# Patient Record
Sex: Male | Born: 1990 | Race: White | Hispanic: No | Marital: Single | Smoking: Current every day smoker
Health system: Southern US, Community
[De-identification: ages and names within clinical notes are randomized; demographics above are authoritative.]

## PROBLEM LIST (undated history)

## (undated) DIAGNOSIS — J209 Acute bronchitis, unspecified: Secondary | ICD-10-CM

## (undated) HISTORY — PX: BLADDER SURGERY: SHX569

---

## 2010-09-23 ENCOUNTER — Emergency Department (HOSPITAL_COMMUNITY)
Admission: EM | Admit: 2010-09-23 | Discharge: 2010-09-23 | Payer: Self-pay | Source: Home / Self Care | Admitting: Emergency Medicine

## 2012-02-16 ENCOUNTER — Emergency Department (HOSPITAL_COMMUNITY)
Admission: EM | Admit: 2012-02-16 | Discharge: 2012-02-16 | Disposition: A | Discharge status: Home / Self Care | Payer: Self-pay | Attending: Emergency Medicine | Admitting: Emergency Medicine

## 2012-02-16 ENCOUNTER — Encounter (HOSPITAL_COMMUNITY): Payer: Self-pay | Admitting: *Deleted

## 2012-02-16 DIAGNOSIS — F17200 Nicotine dependence, unspecified, uncomplicated: Secondary | ICD-10-CM | POA: Insufficient documentation

## 2012-02-16 DIAGNOSIS — L239 Allergic contact dermatitis, unspecified cause: Secondary | ICD-10-CM

## 2012-02-16 DIAGNOSIS — R21 Rash and other nonspecific skin eruption: Secondary | ICD-10-CM | POA: Insufficient documentation

## 2012-02-16 HISTORY — DX: Acute bronchitis, unspecified: J20.9

## 2012-02-16 MED ORDER — EPINEPHRINE 0.3 MG/0.3ML IJ DEVI
0.3000 mg | Freq: Once | INTRAMUSCULAR | Status: AC
  Administered 2012-02-16: 0.3 mg via INTRAMUSCULAR
  Filled 2012-02-16: qty 0.3

## 2012-02-16 MED ORDER — DIPHENHYDRAMINE HCL 12.5 MG/5ML PO ELIX
25.0000 mg | ORAL_SOLUTION | Freq: Once | ORAL | Status: AC
  Administered 2012-02-16: 25 mg via ORAL
  Filled 2012-02-16: qty 10

## 2012-02-16 MED ORDER — METHYLPREDNISOLONE SODIUM SUCC 125 MG IJ SOLR
125.0000 mg | Freq: Once | INTRAMUSCULAR | Status: AC
  Administered 2012-02-16: 125 mg via INTRAVENOUS
  Filled 2012-02-16: qty 2

## 2012-02-16 NOTE — ED Provider Notes (Signed)
History     CSN: [id number]  Arrival date & time 02/16/12  0135   First MD Initiated Contact with Patient 02/16/12 0154      Chief Complaint  Patient presents with  . Rash    (Consider location/radiation/quality/duration/timing/severity/associated sxs/prior treatment) Patient is a 21 y.o. male presenting with rash. The history is provided by the patient (pt complains of a rash).  Rash  This is a new problem. The current episode started 2 days ago. The problem has not changed since onset.The problem is associated with nothing. There has been no fever. The rash is present on the torso. The pain is at a severity of 0/10. The patient is experiencing no pain. Pertinent negatives include no blisters.    Past Medical History  Diagnosis Date  . Bronchitis, acute     History reviewed. No pertinent past surgical history.  History reviewed. No pertinent family history.  History  Substance Use Topics  . Smoking status: Current Everyday Smoker -- 1.0 packs/day  . Smokeless tobacco: Not on file  . Alcohol Use: No     occasional      Review of Systems  Constitutional: Negative for fatigue.  HENT: Negative for congestion, sinus pressure and ear discharge.   Eyes: Negative for discharge.  Respiratory: Negative for cough.   Cardiovascular: Negative for chest pain.  Gastrointestinal: Negative for abdominal pain and diarrhea.  Genitourinary: Negative for frequency and hematuria.  Musculoskeletal: Negative for back pain.  Skin: Positive for rash.  Neurological: Negative for seizures and headaches.  Hematological: Negative.   Psychiatric/Behavioral: Negative for hallucinations.    Allergies  Review of patient's allergies indicates no known allergies.  Home Medications  No current outpatient prescriptions on file.  BP 136/77  Pulse 86  Temp 98 F (36.7 C)  Resp 20  Ht 6' (1.829 m)  Wt 150 lb (68.04 kg)  BMI 20.34 kg/m2  SpO2 97%  Physical Exam  Constitutional: He is  oriented to person, place, and time. He appears well-developed.  HENT:  Head: Normocephalic and atraumatic.  Eyes: Conjunctivae and EOM are normal. No scleral icterus.  Neck: Neck supple. No thyromegaly present.  Cardiovascular: Normal rate and regular rhythm.  Exam reveals no gallop and no friction rub.   No murmur heard. Pulmonary/Chest: No stridor. He has no wheezes. He has no rales. He exhibits no tenderness.  Abdominal: He exhibits no distension. There is no tenderness. There is no rebound.  Musculoskeletal: Normal range of motion. He exhibits no edema.  Lymphadenopathy:    He has no cervical adenopathy.  Neurological: He is oriented to person, place, and time. Coordination normal.  Skin: Rash noted. No erythema.  Psychiatric: He has a normal mood and affect. His behavior is normal.    ED Course  Procedures (including critical care time)  Labs Reviewed - No data to display No results found.   1. Rash     Pt improved with tx  MDM  Allergic dermatitis        Benny Lennert, MD 02/16/12 [phone]

## 2012-02-16 NOTE — ED Notes (Signed)
Has removed a number of ticks from himself - thinks the last time was a month ago - not sure

## 2012-02-16 NOTE — Discharge Instructions (Signed)
Take benadryl for the rash.  Follow up next week if not improving.

## 2012-02-16 NOTE — ED Notes (Signed)
Pt reporting rash on arms and trunk. States rash began today.  Pt reporting minor cough.  Also reports he passed out at work on Friday.

## 2013-10-08 ENCOUNTER — Emergency Department (HOSPITAL_COMMUNITY)
Admission: EM | Admit: 2013-10-08 | Discharge: 2013-10-09 | Disposition: A | Discharge status: Home / Self Care | Payer: Self-pay | Attending: Emergency Medicine | Admitting: Emergency Medicine

## 2013-10-08 ENCOUNTER — Emergency Department (HOSPITAL_COMMUNITY): Payer: Self-pay

## 2013-10-08 ENCOUNTER — Encounter (HOSPITAL_COMMUNITY): Payer: Self-pay | Admitting: Emergency Medicine

## 2013-10-08 DIAGNOSIS — Z8709 Personal history of other diseases of the respiratory system: Secondary | ICD-10-CM | POA: Insufficient documentation

## 2013-10-08 DIAGNOSIS — Z9889 Other specified postprocedural states: Secondary | ICD-10-CM | POA: Insufficient documentation

## 2013-10-08 DIAGNOSIS — R1031 Right lower quadrant pain: Secondary | ICD-10-CM | POA: Insufficient documentation

## 2013-10-08 DIAGNOSIS — R109 Unspecified abdominal pain: Secondary | ICD-10-CM

## 2013-10-08 DIAGNOSIS — Z87448 Personal history of other diseases of urinary system: Secondary | ICD-10-CM | POA: Insufficient documentation

## 2013-10-08 DIAGNOSIS — F17200 Nicotine dependence, unspecified, uncomplicated: Secondary | ICD-10-CM | POA: Insufficient documentation

## 2013-10-08 LAB — URINALYSIS, ROUTINE W REFLEX MICROSCOPIC
BILIRUBIN URINE: NEGATIVE
GLUCOSE, UA: NEGATIVE mg/dL
HGB URINE DIPSTICK: NEGATIVE
KETONES UR: NEGATIVE mg/dL
LEUKOCYTES UA: NEGATIVE
Nitrite: NEGATIVE
PH: 6 (ref 5.0–8.0)
PROTEIN: NEGATIVE mg/dL
Specific Gravity, Urine: 1.025 (ref 1.005–1.030)
Urobilinogen, UA: 0.2 mg/dL (ref 0.0–1.0)

## 2013-10-08 LAB — CBC WITH DIFFERENTIAL/PLATELET
BASOS ABS: 0.1 10*3/uL (ref 0.0–0.1)
Basophils Relative: 1 % (ref 0–1)
Eosinophils Absolute: 0.2 10*3/uL (ref 0.0–0.7)
Eosinophils Relative: 3 % (ref 0–5)
HEMATOCRIT: 40.3 % (ref 39.0–52.0)
HEMOGLOBIN: 13.6 g/dL (ref 13.0–17.0)
LYMPHS PCT: 39 % (ref 12–46)
Lymphs Abs: 3.2 10*3/uL (ref 0.7–4.0)
MCH: 29.5 pg (ref 26.0–34.0)
MCHC: 33.7 g/dL (ref 30.0–36.0)
MCV: 87.4 fL (ref 78.0–100.0)
MONO ABS: 0.7 10*3/uL (ref 0.1–1.0)
MONOS PCT: 9 % (ref 3–12)
NEUTROS ABS: 3.9 10*3/uL (ref 1.7–7.7)
NEUTROS PCT: 49 % (ref 43–77)
Platelets: 269 10*3/uL (ref 150–400)
RBC: 4.61 MIL/uL (ref 4.22–5.81)
RDW: 12.2 % (ref 11.5–15.5)
WBC: 8.1 10*3/uL (ref 4.0–10.5)

## 2013-10-08 LAB — COMPREHENSIVE METABOLIC PANEL
ALK PHOS: 70 U/L (ref 39–117)
ALT: 13 U/L (ref 0–53)
AST: 14 U/L (ref 0–37)
Albumin: 4 g/dL (ref 3.5–5.2)
BILIRUBIN TOTAL: 0.2 mg/dL — AB (ref 0.3–1.2)
BUN: 13 mg/dL (ref 6–23)
CHLORIDE: 101 meq/L (ref 96–112)
CO2: 27 meq/L (ref 19–32)
CREATININE: 1.08 mg/dL (ref 0.50–1.35)
Calcium: 9.6 mg/dL (ref 8.4–10.5)
GFR calc Af Amer: >90 mL/min (ref >90)
Glucose, Bld: 89 mg/dL (ref 70–99)
POTASSIUM: 3.6 meq/L — AB (ref 3.7–5.3)
Sodium: 140 mEq/L (ref 137–147)
Total Protein: 8.4 g/dL — ABNORMAL HIGH (ref 6.0–8.3)

## 2013-10-08 LAB — LIPASE, BLOOD: Lipase: 27 U/L (ref 11–59)

## 2013-10-08 MED ORDER — SODIUM CHLORIDE 0.9 % IV SOLN
INTRAVENOUS | Status: DC
  Administered 2013-10-08: 22:00:00 via INTRAVENOUS

## 2013-10-08 MED ORDER — ONDANSETRON HCL 4 MG/2ML IJ SOLN
4.0000 mg | Freq: Once | INTRAMUSCULAR | Status: DC
  Filled 2013-10-08: qty 2

## 2013-10-08 MED ORDER — ONDANSETRON HCL 4 MG/2ML IJ SOLN
4.0000 mg | Freq: Once | INTRAMUSCULAR | Status: AC
  Administered 2013-10-08: 4 mg via INTRAVENOUS

## 2013-10-08 MED ORDER — FENTANYL CITRATE 0.05 MG/ML IJ SOLN
25.0000 ug | Freq: Once | INTRAMUSCULAR | Status: AC
  Administered 2013-10-08: 25 ug via INTRAVENOUS
  Filled 2013-10-08: qty 2

## 2013-10-08 NOTE — ED Notes (Signed)
Pt complaining of RLQ pain for the past 3 days, pain has gotten worse today. Pt denies having fevers, vomiting, or diarrhea.

## 2013-10-08 NOTE — ED Provider Notes (Signed)
CSN: [id number]     Arrival date & time 10/08/13  1904 History  This chart was scribed for Dylan GivensIva L Cache Decoursey, MD by Quintella ReichertMatthew Underwood, ED scribe.  This patient was seen in room [facility]08/[facility]08 and the patient's care was started at 9:36 PM.   Chief Complaint  Patient presents with  . Abdominal Pain    The history is provided by the patient. No language interpreter was used.    HPI Comments: Dylan SaunasJustin James is a 23 y.o. male with no chronic medical conditions who presents to the Emergency Department complaining of intermittent lateral right abdominal pain that began 3 days ago and worsened today.  Pt states his pain was initially brought on by certain movements and lasted for several seconds.  It is still intermittent but is lasting several minutes and is more severe.  Pain is described as "something there pushing into my side."  Currently he rates pain at a severity of 4/10.  It is sometimes worsened by lying on his right side and relieved by standing up.  He also states that at some point he was holding his right side while walking but in general he does not feel it is worsened by walking.  He denies nausea, vomiting, diarrhea, constipation, fever, dysuria, difficulty urinating, or new back pain.  He is eating and drinking normally.  Pt denies prior h/o similar pain.  He denies recent injuries or unusual activities that may have cause pain.  He admits to h/o bladder surgery for some type of bladder problem at age 89 or 89 but denies any other surgical history.  He denies family h/o kidney stones to his knowledge.  He is a current one-pack-per-day smoker.  He is not employed currently.  Pt has no PCP   Past Medical History  Diagnosis Date  . Bronchitis, acute     History reviewed. No pertinent past surgical history.  No family history on file.   History  Substance Use Topics  . Smoking status: Current Every Day Smoker -- 1.00 packs/day  . Smokeless tobacco: Not on file  . Alcohol Use: No      Comment: occasional  lives at home with spouse unemployed   Review of Systems  Constitutional: Negative for fever.  Gastrointestinal: Positive for abdominal pain. Negative for nausea, vomiting, diarrhea and constipation.  Genitourinary: Negative for dysuria and difficulty urinating.  Musculoskeletal: Negative for back pain (no new back pain).  All other systems reviewed and are negative.     Allergies  Review of patient's allergies indicates no known allergies.  Home Medications   None  BP 125/73  Pulse 67  Temp(Src) 97.7 F (36.5 C) (Oral)  Resp 18  Ht 6' (1.829 m)  Wt 148 lb (67.132 kg)  BMI 20.07 kg/m2  SpO2 100%  Vital signs normal    Physical Exam  Nursing note and vitals reviewed. Constitutional: He is oriented to person, place, and time. He appears well-developed and well-nourished.  Non-toxic appearance. He does not appear ill. No distress.  HENT:  Head: Normocephalic and atraumatic.  Right Ear: External ear normal.  Left Ear: External ear normal.  Nose: Nose normal. No mucosal edema or rhinorrhea.  Mouth/Throat: Oropharynx is clear and moist and mucous membranes are normal. No dental abscesses or uvula swelling.  Eyes: Conjunctivae and EOM are normal. Pupils are equal, round, and reactive to light.  Neck: Normal range of motion and full passive range of motion without pain. Neck supple.  Cardiovascular: Normal rate, regular rhythm and  normal heart sounds.  Exam reveals no gallop and no friction rub.   No murmur heard. Pulmonary/Chest: Effort normal and breath sounds normal. No respiratory distress. He has no wheezes. He has no rhonchi. He has no rales. He exhibits no tenderness and no crepitus.  Abdominal: Soft. Normal appearance and bowel sounds are normal. He exhibits no distension. There is tenderness. There is no rebound, no guarding and no CVA tenderness.    Diffuse tenderness in the right abdomen and over the suprapubic area.  Most tender in the  mid-right lateral abdomen.  Musculoskeletal: Normal range of motion. He exhibits no edema and no tenderness.  Moves all extremities well.   Neurological: He is alert and oriented to person, place, and time. He has normal strength. No cranial nerve deficit.  Skin: Skin is warm, dry and intact. No rash noted. No erythema. No pallor.  Psychiatric: He has a normal mood and affect. His speech is normal and behavior is normal. His mood appears not anxious.    ED Course  Procedures (including critical care time)  Medications  0.9 %  sodium chloride infusion ( Intravenous New Bag/Given 10/08/13 2152)  fentaNYL (SUBLIMAZE) injection 25 mcg (25 mcg Intravenous Given 10/08/13 2153)  ondansetron (ZOFRAN) injection 4 mg (4 mg Intravenous Given 10/08/13 2152)     DIAGNOSTIC STUDIES: Oxygen Saturation is 100% on room air, normal by my interpretation.    COORDINATION OF CARE: 9:44 PM-Discussed treatment plan which includes labs with pt at bedside and pt agreed to plan.   Pt 's pain much better after medications. Pt given test results. Pt most likely has some abdominal wall strain causing his pain with normal labs and CT scan.     Labs Review Results for orders placed during the hospital encounter of 10/08/13  CBC WITH DIFFERENTIAL      Result Value Range   WBC 8.1  4.0 - 10.5 K/uL   RBC 4.61  4.22 - 5.81 MIL/uL   Hemoglobin 13.6  13.0 - 17.0 g/dL   HCT 69.6  29.5 - 28.4 %   MCV 87.4  78.0 - 100.0 fL   MCH 29.5  26.0 - 34.0 pg   MCHC 33.7  30.0 - 36.0 g/dL   RDW 13.2  44.0 - 10.2 %   Platelets 269  150 - 400 K/uL   Neutrophils Relative % 49  43 - 77 %   Neutro Abs 3.9  1.7 - 7.7 K/uL   Lymphocytes Relative 39  12 - 46 %   Lymphs Abs 3.2  0.7 - 4.0 K/uL   Monocytes Relative 9  3 - 12 %   Monocytes Absolute 0.7  0.1 - 1.0 K/uL   Eosinophils Relative 3  0 - 5 %   Eosinophils Absolute 0.2  0.0 - 0.7 K/uL   Basophils Relative 1  0 - 1 %   Basophils Absolute 0.1  0.0 - 0.1 K/uL   COMPREHENSIVE METABOLIC PANEL      Result Value Range   Sodium 140  137 - 147 mEq/L   Potassium 3.6 (*) 3.7 - 5.3 mEq/L   Chloride 101  96 - 112 mEq/L   CO2 27  19 - 32 mEq/L   Glucose, Bld 89  70 - 99 mg/dL   BUN 13  6 - 23 mg/dL   Creatinine, Ser 7.25  0.50 - 1.35 mg/dL   Calcium 9.6  8.4 - 36.6 mg/dL   Total Protein 8.4 (*) 6.0 - 8.3 g/dL  Albumin 4.0  3.5 - 5.2 g/dL   AST 14  0 - 37 U/L   ALT 13  0 - 53 U/L   Alkaline Phosphatase 70  39 - 117 U/L   Total Bilirubin 0.2 (*) 0.3 - 1.2 mg/dL   GFR calc non Af Amer >90  >90 mL/min   GFR calc Af Amer >90  >90 mL/min  LIPASE, BLOOD      Result Value Range   Lipase 27  11 - 59 U/L  URINALYSIS, ROUTINE W REFLEX MICROSCOPIC      Result Value Range   Color, Urine YELLOW  YELLOW   APPearance CLEAR  CLEAR   Specific Gravity, Urine 1.025  1.005 - 1.030   pH 6.0  5.0 - 8.0   Glucose, UA NEGATIVE  NEGATIVE mg/dL   Hgb urine dipstick NEGATIVE  NEGATIVE   Bilirubin Urine NEGATIVE  NEGATIVE   Ketones, ur NEGATIVE  NEGATIVE mg/dL   Protein, ur NEGATIVE  NEGATIVE mg/dL   Urobilinogen, UA 0.2  0.0 - 1.0 mg/dL   Nitrite NEGATIVE  NEGATIVE   Leukocytes, UA NEGATIVE  NEGATIVE   Laboratory interpretation all normal   Imaging Review Ct Abdomen Pelvis Wo Contrast  10/09/2013   CLINICAL DATA:  Right-sided abdominal pain.  EXAM: CT ABDOMEN AND PELVIS WITHOUT CONTRAST  TECHNIQUE: Multidetector CT imaging of the abdomen and pelvis was performed following the standard protocol without intravenous contrast.  COMPARISON:  None.  FINDINGS: The visualized lung bases are clear.  The liver and spleen are unremarkable in appearance. The gallbladder is within normal limits. The pancreas and adrenal glands are unremarkable.  The kidneys are unremarkable in appearance. There is no evidence of hydronephrosis. No renal or ureteral stones are seen. No perinephric stranding is appreciated.  No free fluid is identified. The small bowel is unremarkable in appearance.  The stomach is within normal limits. No acute vascular abnormalities are seen.  The appendix is normal in caliber and contains air, without evidence for appendicitis. The colon is unremarkable in appearance.  The bladder is mildly distended and grossly unremarkable in appearance. The prostate remains normal in size. No inguinal lymphadenopathy is seen.  No acute osseous abnormalities are identified.  IMPRESSION: No acute abnormality seen within the abdomen or pelvis.   Electronically Signed   By: Roanna Raider M.D.   On: 10/09/2013 00:28    EKG Interpretation   None       MDM   1. Abdominal pain   2. Abdominal wall pain     New Prescriptions   CYCLOBENZAPRINE (FLEXERIL) 5 MG TABLET    Take 1 tablet (5 mg total) by mouth 3 (three) times daily as needed for muscle spasms.   NAPROXEN (NAPROSYN) 375 MG TABLET    Take 1 tablet (375 mg total) by mouth 2 (two) times daily.    Plan discharge   Devoria Albe, MD, FACEP    I personally performed the services described in this documentation, which was scribed in my presence. The recorded information has been reviewed and considered.  Devoria Albe, MD, Armando Gang   Dylan Givens, MD 10/09/13 [phone]

## 2013-10-08 NOTE — ED Notes (Signed)
MD at bedside. 

## 2013-10-09 MED ORDER — NAPROXEN 375 MG PO TABS: 375.0000 mg | ORAL_TABLET | Freq: Two times a day (BID) | ORAL | Status: DC

## 2013-10-09 MED ORDER — CYCLOBENZAPRINE HCL 5 MG PO TABS: 5.0000 mg | ORAL_TABLET | Freq: Three times a day (TID) | ORAL | Status: DC | PRN

## 2013-10-09 NOTE — Discharge Instructions (Signed)
Drink plenty of fluids. Take the medications as prescribed [facility] Recheck if you get severe, constant pain, vomiting, fever or you seem worse.

## 2014-11-10 IMAGING — CT CT ABD-PELV W/O CM
2 of 3 series · 9 of 46 positions shown, 11 images · non-contrast
Comparison: None.

CLINICAL DATA: Right-sided abdominal pain.

EXAM:
CT ABDOMEN AND PELVIS WITHOUT CONTRAST
TECHNIQUE: Multidetector CT imaging of the abdomen and pelvis was performed
following the standard protocol without intravenous contrast.

[Series 4: mpr coronal (id) · coronal · 0.65mm/px · 8 of 77 slices shown, 9 images]
[im 9/77  soft-tissue]
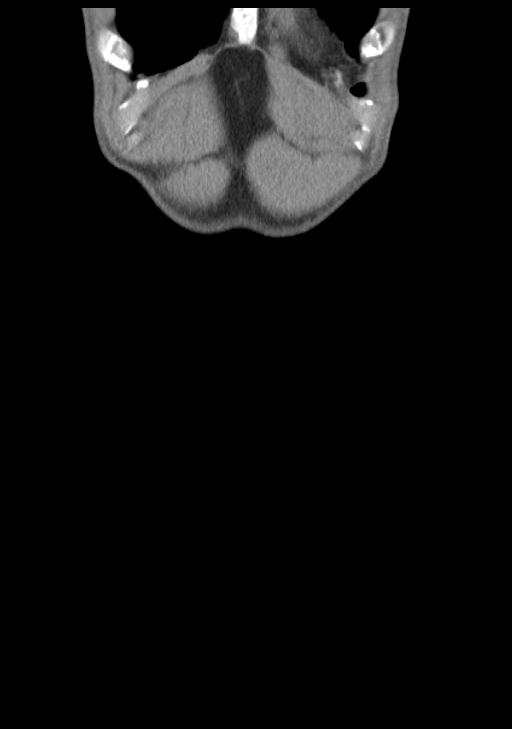
[im 9/77  bone]
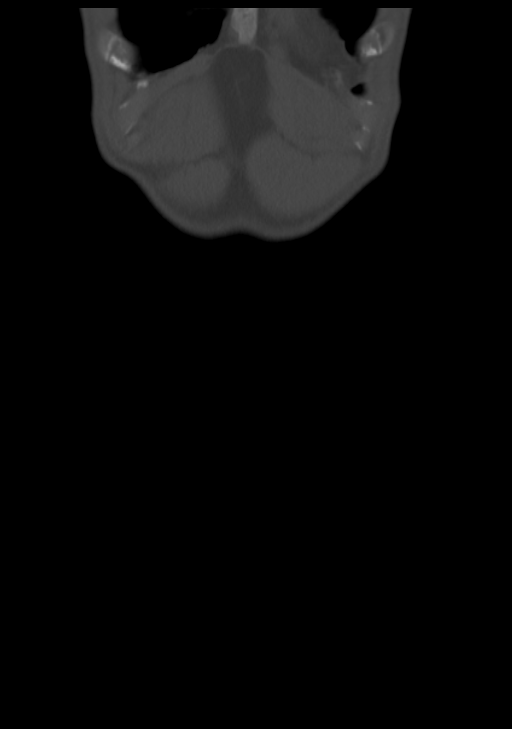
[im 17/77  soft-tissue]
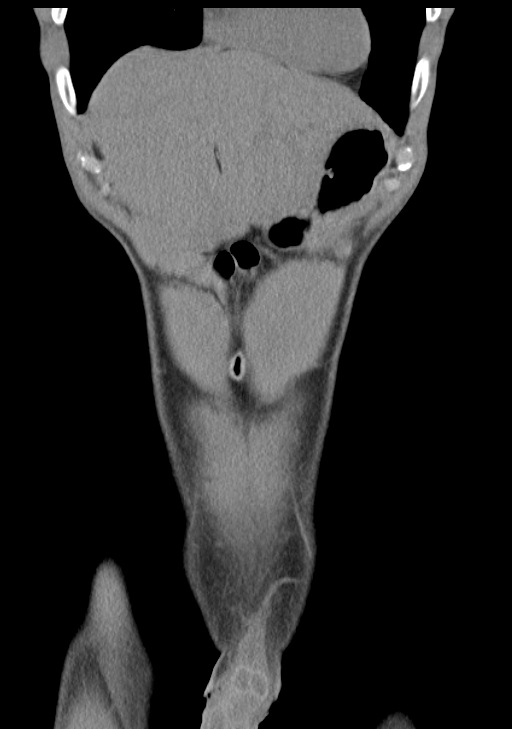
[im 26/77  soft-tissue]
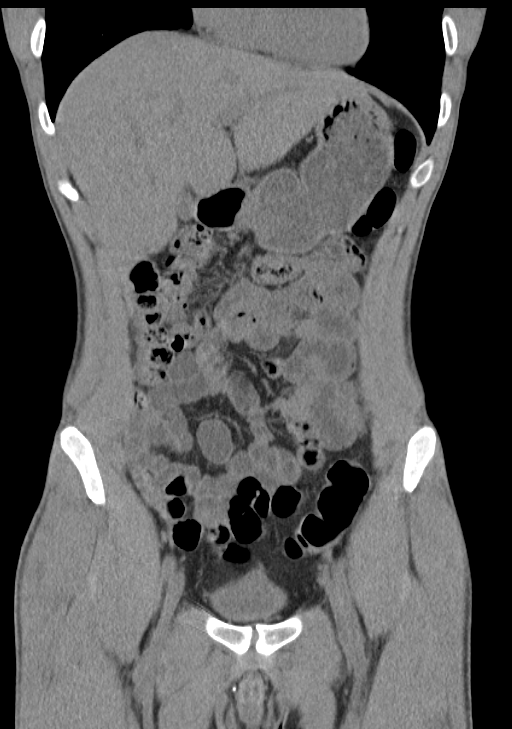
[im 34/77  soft-tissue]
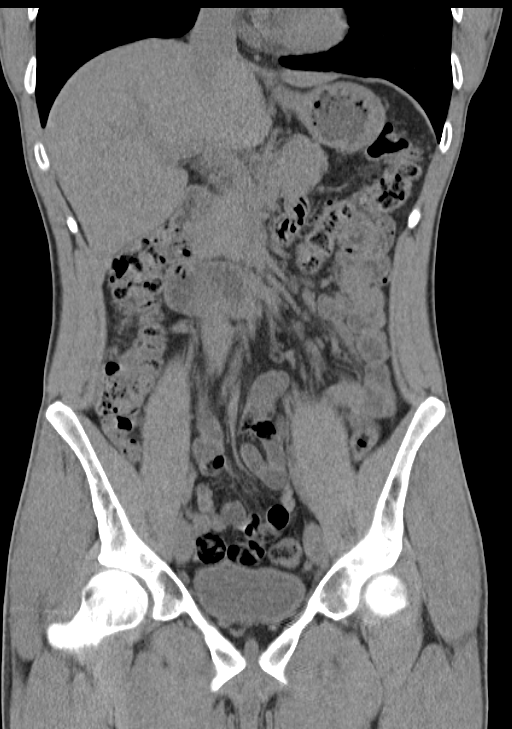
[im 43/77  soft-tissue]
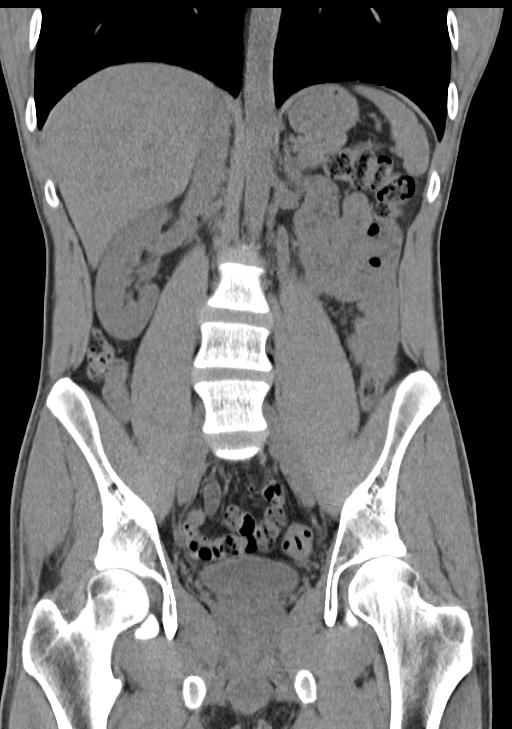
[im 51/77  soft-tissue]
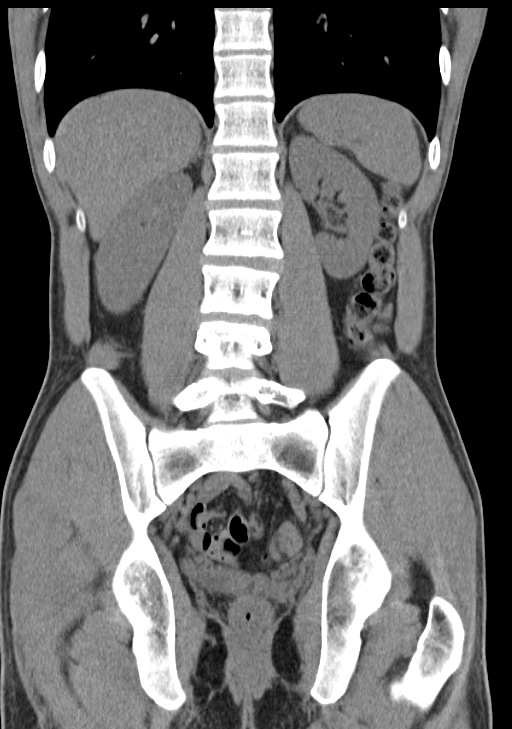
[im 60/77  soft-tissue]
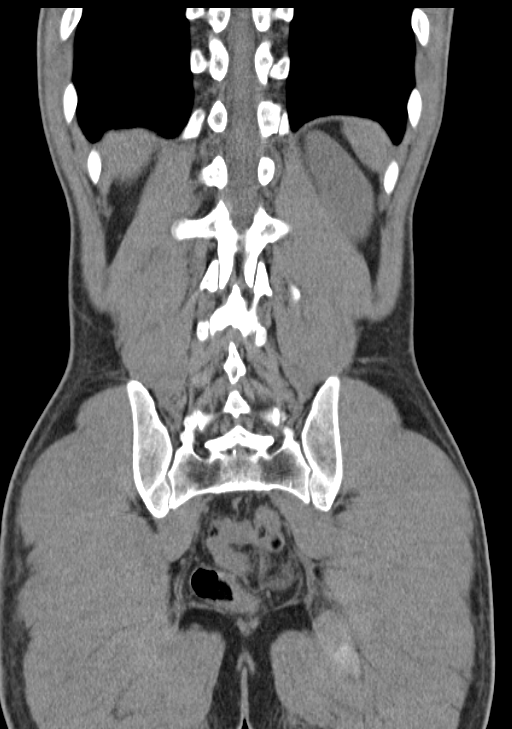
[im 68/77  soft-tissue]
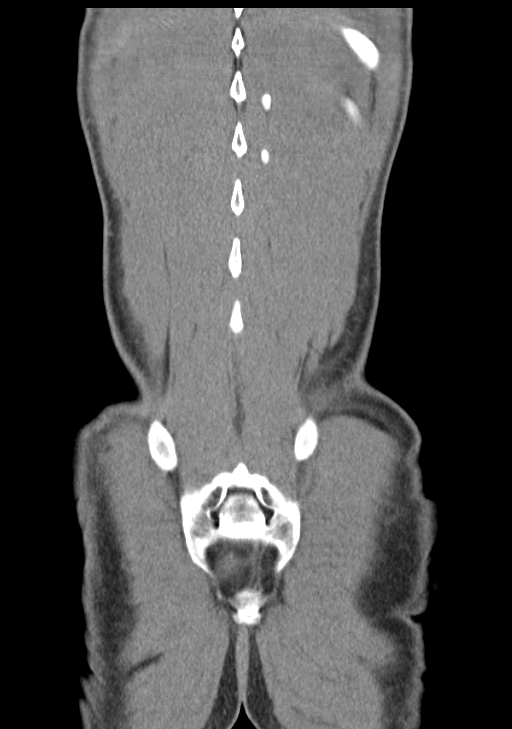

[Series 5: mpr sagittal (id) · sagittal · 0.45mm/px · 1 of 107 slices shown, 2 images]
[im 36/107  soft-tissue]
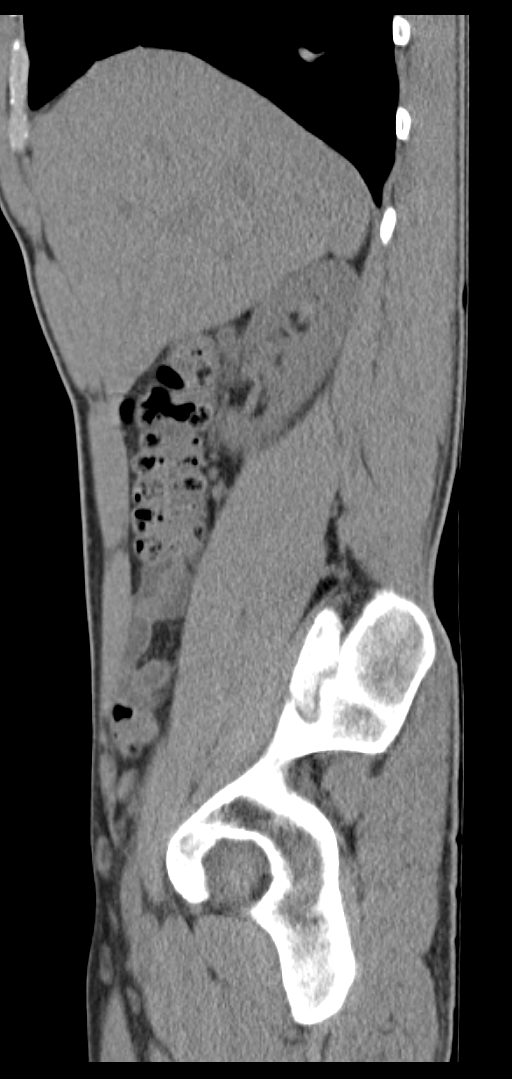
[im 36/107  bone]
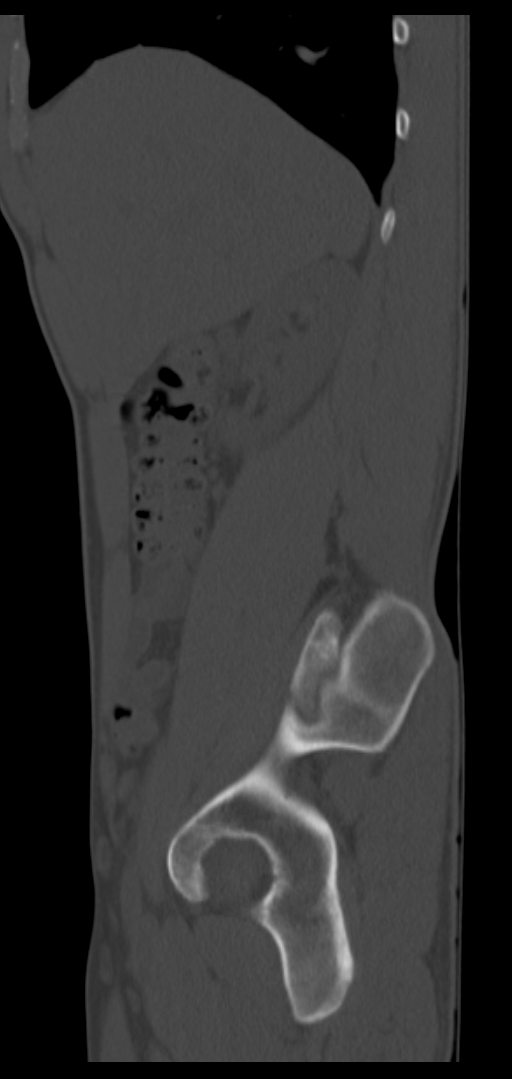

[9 of 46 positions shown; findings below may reference images not displayed]

FINDINGS: The visualized lung bases are clear.

The liver and spleen are unremarkable in appearance. The gallbladder
is within normal limits. The pancreas and adrenal glands are
unremarkable.

The kidneys are unremarkable in appearance. There is no evidence of
hydronephrosis. No renal or ureteral stones are seen. No perinephric
stranding is appreciated.

No free fluid is identified. The small bowel is unremarkable in
appearance. The stomach is within normal limits. No acute vascular
abnormalities are seen.

The appendix is normal in caliber and contains air, without evidence
for appendicitis. The colon is unremarkable in appearance.

The bladder is mildly distended and grossly unremarkable in
appearance. The prostate remains normal in size. No inguinal
lymphadenopathy is seen.

No acute osseous abnormalities are identified.
IMPRESSION: No acute abnormality seen within the abdomen or pelvis.

## 2014-12-07 ENCOUNTER — Emergency Department (HOSPITAL_COMMUNITY)
Admission: EM | Admit: 2014-12-07 | Discharge: 2014-12-07 | Disposition: A | Discharge status: Home / Self Care | Payer: No Typology Code available for payment source | Attending: Emergency Medicine | Admitting: Emergency Medicine

## 2014-12-07 ENCOUNTER — Encounter (HOSPITAL_COMMUNITY): Payer: Self-pay | Admitting: *Deleted

## 2014-12-07 DIAGNOSIS — Z791 Long term (current) use of non-steroidal anti-inflammatories (NSAID): Secondary | ICD-10-CM | POA: Insufficient documentation

## 2014-12-07 DIAGNOSIS — M546 Pain in thoracic spine: Secondary | ICD-10-CM

## 2014-12-07 DIAGNOSIS — Z720 Tobacco use: Secondary | ICD-10-CM | POA: Insufficient documentation

## 2014-12-07 DIAGNOSIS — M62830 Muscle spasm of back: Secondary | ICD-10-CM | POA: Diagnosis not present

## 2014-12-07 DIAGNOSIS — Z79899 Other long term (current) drug therapy: Secondary | ICD-10-CM | POA: Insufficient documentation

## 2014-12-07 DIAGNOSIS — Z8709 Personal history of other diseases of the respiratory system: Secondary | ICD-10-CM | POA: Insufficient documentation

## 2014-12-07 DIAGNOSIS — M549 Dorsalgia, unspecified: Secondary | ICD-10-CM | POA: Diagnosis present

## 2014-12-07 MED ORDER — HYDROCODONE-ACETAMINOPHEN 5-325 MG PO TABS
1.0000 | ORAL_TABLET | Freq: Once | ORAL | Status: AC
  Administered 2014-12-07: 1 via ORAL
  Filled 2014-12-07: qty 1

## 2014-12-07 MED ORDER — NAPROXEN 500 MG PO TABS: 500.0000 mg | ORAL_TABLET | Freq: Two times a day (BID) | ORAL | Status: DC

## 2014-12-07 MED ORDER — CYCLOBENZAPRINE HCL 10 MG PO TABS: 10.0000 mg | ORAL_TABLET | Freq: Three times a day (TID) | ORAL | Status: DC | PRN

## 2014-12-07 MED ORDER — HYDROCODONE-ACETAMINOPHEN 5-325 MG PO TABS: ORAL_TABLET | ORAL | Status: DC

## 2014-12-07 MED ORDER — CYCLOBENZAPRINE HCL 10 MG PO TABS
10.0000 mg | ORAL_TABLET | Freq: Once | ORAL | Status: AC
  Administered 2014-12-07: 10 mg via ORAL
  Filled 2014-12-07: qty 1

## 2014-12-07 NOTE — Discharge Instructions (Signed)
Muscle Cramps and Spasms Muscle cramps and spasms are when muscles tighten by themselves. They usually get better within minutes. Muscle cramps are painful. They are usually stronger and last longer than muscle spasms. Muscle spasms may or may not be painful. They can last a few seconds or much longer. HOME CARE  Drink enough fluid to keep your pee (urine) clear or pale yellow.  Massage, stretch, and relax the muscle.  Use a warm towel, heating pad, or warm shower water on tight muscles.  Place ice on the muscle if it is tender or in pain.  Put ice in a plastic bag.  Place a towel between your skin and the bag.  Leave the ice on for 15-20 minutes, 03-04 times a day.  Only take medicine as told by your doctor. GET HELP RIGHT AWAY IF:  Your cramps or spasms get worse, happen more often, or do not get better with time. MAKE SURE YOU:  Understand these instructions.  Will watch your condition.  Will get help right away if you are not doing well or get worse. Document Released: 08/16/2008 Document Revised: 12/29/2012 Document Reviewed: 08/20/2012 [facility] Patient Information 2015 [place], [place]. This information is not intended to replace advice given to you by your health care provider. Make sure you discuss any questions you have with your health care provider.  Musculoskeletal Pain Musculoskeletal pain is muscle and boney aches and pains. These pains can occur in any part of the body. Your caregiver may treat you without knowing the cause of the pain. They may treat you if blood or urine tests, X-rays, and other tests were normal.  CAUSES There is often not a definite cause or reason for these pains. These pains may be caused by a type of germ (virus). The discomfort may also come from overuse. Overuse includes working out too hard when your body is not fit. Boney aches also come from weather changes. Bone is sensitive to atmospheric pressure changes. HOME CARE INSTRUCTIONS   Ask  when your test results will be ready. Make sure you get your test results.  Only take over-the-counter or prescription medicines for pain, discomfort, or fever as directed by your caregiver. If you were given medications for your condition, do not drive, operate machinery or power tools, or sign legal documents for 24 hours. Do not drink alcohol. Do not take sleeping pills or other medications that may interfere with treatment.  Continue all activities unless the activities cause more pain. When the pain lessens, slowly resume normal activities. Gradually increase the intensity and duration of the activities or exercise.  During periods of severe pain, bed rest may be helpful. Lay or sit in any position that is comfortable.  Putting ice on the injured area.  Put ice in a bag.  Place a towel between your skin and the bag.  Leave the ice on for 15 to 20 minutes, 3 to 4 times a day.  Follow up with your caregiver for continued problems and no reason can be found for the pain. If the pain becomes worse or does not go away, it may be necessary to repeat tests or do additional testing. Your caregiver may need to look further for a possible cause. SEEK IMMEDIATE MEDICAL CARE IF:  You have pain that is getting worse and is not relieved by medications.  You develop chest pain that is associated with shortness or breath, sweating, feeling sick to your stomach (nauseous), or throw up (vomit).  Your pain becomes  localized to the abdomen.  You develop any new symptoms that seem different or that concern you. MAKE SURE YOU:   Understand these instructions.  Will watch your condition.  Will get help right away if you are not doing well or get worse. Document Released: 09/03/2005 Document Revised: 11/26/2011 Document Reviewed: 05/08/2013 [facility] Patient Information 2015 [place], [place]. This information is not intended to replace advice given to you by your health care provider. Make sure you  discuss any questions you have with your health care provider.
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
## 2014-12-07 NOTE — ED Notes (Signed)
Back pain,  Onset today.  Says he could not get up this am due to pain

## 2014-12-07 NOTE — ED Notes (Signed)
Pt alert & oriented x4, stable gait. Patient given discharge instructions, paperwork & prescription(s). Patient informed not to drive, operate any equipment & handel any important documents 4 hours after taking pain medication. Patient  instructed to stop at the registration desk to finish any additional paperwork. Patient  verbalized understanding. Pt left department w/ no further questions. 
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
## 2016-10-18 ENCOUNTER — Emergency Department (HOSPITAL_COMMUNITY)
Admission: EM | Admit: 2016-10-18 | Discharge: 2016-10-18 | Disposition: A | Discharge status: Home / Self Care | Payer: No Typology Code available for payment source | Attending: Emergency Medicine | Admitting: Emergency Medicine

## 2016-10-18 ENCOUNTER — Emergency Department (HOSPITAL_COMMUNITY): Payer: No Typology Code available for payment source

## 2016-10-18 ENCOUNTER — Encounter (HOSPITAL_COMMUNITY): Payer: Self-pay | Admitting: *Deleted

## 2016-10-18 DIAGNOSIS — F17210 Nicotine dependence, cigarettes, uncomplicated: Secondary | ICD-10-CM | POA: Insufficient documentation

## 2016-10-18 DIAGNOSIS — Z79899 Other long term (current) drug therapy: Secondary | ICD-10-CM | POA: Insufficient documentation

## 2016-10-18 DIAGNOSIS — F1290 Cannabis use, unspecified, uncomplicated: Secondary | ICD-10-CM | POA: Insufficient documentation

## 2016-10-18 DIAGNOSIS — J40 Bronchitis, not specified as acute or chronic: Secondary | ICD-10-CM | POA: Insufficient documentation

## 2016-10-18 MED ORDER — ALBUTEROL SULFATE HFA 108 (90 BASE) MCG/ACT IN AERS
2.0000 | INHALATION_SPRAY | Freq: Once | RESPIRATORY_TRACT | Status: AC
  Administered 2016-10-18: 2 via RESPIRATORY_TRACT
  Filled 2016-10-18: qty 6.7

## 2016-10-18 MED ORDER — PREDNISONE 20 MG PO TABS: 40.0000 mg | ORAL_TABLET | Freq: Every day | ORAL | 0 refills | Status: AC

## 2016-10-18 MED ORDER — GUAIFENESIN-CODEINE 100-10 MG/5ML PO SYRP: 10.0000 mL | ORAL_SOLUTION | Freq: Three times a day (TID) | ORAL | 0 refills | Status: AC | PRN

## 2016-10-18 NOTE — ED Triage Notes (Signed)
Pt comes in with cough and congestion for 2 weeks. Pt has has intermittent diarrhea, last BM was solid. Denies any vomiting.

## 2016-10-18 NOTE — Discharge Instructions (Signed)
2 puffs of the inhaler 4 times a day as needed. Stop the over-the-counter cough and cold medication. Tylenol or ibuprofen if needed for fever or body aches. Follow-up with your primary care doctor or return to the ER for any worsening symptoms

## 2016-10-18 NOTE — ED Notes (Signed)
Pt made aware to return if symptoms worsen or if any life threatening symptoms occur.   

## 2016-10-20 NOTE — ED Provider Notes (Signed)
[facility]PT Provider Note   CSN: [id number] Arrival date & time: 10/18/16  1329     History   Chief Complaint Chief Complaint  Patient presents with  . Cough    HPI Dylan James is a 26 y.o. male.  HPI   Dylan James is a 26 y.o. male who presents to the Emergency Department complaining of cough, nasal congestion.  Symptoms present for 2 weeks.  Cough has been occasionally productive.  Describes intermittent loose stools for several days, but states last BM was solid.  He denies fever, chills, abdominal pain and vomiting.  Cough associated with chest tightness with excessive, frequent coughing.  He denies shortness of breath. He has been taking OTC cough and cold medications without relief.    Past Medical History:  Diagnosis Date  . Bronchitis, acute     There are no active problems to display for this patient.   Past Surgical History:  Procedure Laterality Date  . BLADDER SURGERY         Home Medications    Prior to Admission medications   Medication Sig Start Date End Date Taking? Authorizing Provider  PE-DM-APAP & Doxylamin-DM-APAP (VICKS DAYQUIL/NYQUIL CLD & FLU) (Liquid) MISC Take 2 tablets by mouth 2 (two) times daily.   Yes Historical Provider, MD  guaiFENesin-codeine (ROBITUSSIN AC) 100-10 MG/5ML syrup Take 10 mLs by mouth 3 (three) times daily as needed. 10/18/16   Janasia Coverdale, PA-C  predniSONE (DELTASONE) 20 MG tablet Take 2 tablets (40 mg total) by mouth daily. 10/18/16   Nataliee Shurtz, PA-C    Family History No family history on file.  Social History Social History  Substance Use Topics  . Smoking status: Current Every Day Smoker    Packs/day: 1.00    Types: Cigarettes  . Smokeless tobacco: Never Used  . Alcohol use Yes     Comment: occasional     Allergies   Cinnamon   Review of Systems Review of Systems  Constitutional: Negative for appetite change, chills and fever.  HENT: Positive for congestion. Negative for sore  throat and trouble swallowing.   Respiratory: Positive for cough and chest tightness. Negative for shortness of breath and wheezing.   Cardiovascular: Negative for chest pain.  Gastrointestinal: Negative for abdominal pain, nausea and vomiting.  Musculoskeletal: Negative for arthralgias, myalgias, neck pain and neck stiffness.  Skin: Negative for rash.  Neurological: Negative for dizziness, weakness and numbness.  Hematological: Negative for adenopathy.  All other systems reviewed and are negative.    Physical Exam Updated Vital Signs BP 121/69 (BP Location: Left Arm)   Pulse 92   Temp 99 F (37.2 C) (Oral)   Resp 20   Ht 6' (1.829 m)   Wt 68 kg   SpO2 97%   BMI 20.34 kg/m   Physical Exam  Constitutional: He is oriented to person, place, and time. He appears well-developed and well-nourished. No distress.  HENT:  Head: Normocephalic and atraumatic.  Right Ear: Tympanic membrane and ear canal normal.  Left Ear: Tympanic membrane and ear canal normal.  Mouth/Throat: Uvula is midline, oropharynx is clear and moist and mucous membranes are normal. No oropharyngeal exudate.  Eyes: EOM are normal. Pupils are equal, round, and reactive to light.  Neck: Normal range of motion, full passive range of motion without pain and phonation normal. Neck supple.  Cardiovascular: Normal rate, regular rhythm, normal heart sounds and intact distal pulses.   No murmur heard. Pulmonary/Chest: Effort normal. No stridor. No respiratory distress. He  has no rales. He exhibits no tenderness.  Coarse lungs sounds bilaterally, with few expiratory wheezing.  No respiratory distress. No rales  Musculoskeletal: He exhibits no edema.  Lymphadenopathy:    He has no cervical adenopathy.  Neurological: He is alert and oriented to person, place, and time. He exhibits normal muscle tone. Coordination normal.  Skin: Skin is warm and dry.  Nursing note and vitals reviewed.    ED Treatments / Results   Labs (all labs ordered are listed, but only abnormal results are displayed) Labs Reviewed - No data to display  EKG  EKG Interpretation None       Radiology Dg Chest 2 View  Result Date: 10/18/2016 CLINICAL DATA:  Nonproductive cough, chest congestion, and head cold symptoms for 2 weeks. Current smoker. EXAM: CHEST  2 VIEW COMPARISON:  Chest x-ray dated May 21, 2008 FINDINGS: The lungs are mildly hyperinflated. The interstitial markings are coarse though stable. The heart and pulmonary vascularity are normal. The mediastinum is normal in width. The trachea is midline. The bony thorax exhibits no acute abnormality. IMPRESSION: Mild hyperinflation may be voluntary or may reflect acute bronchitic change. No alveolar pneumonia. Electronically Signed   By: David  [place] M.D.   On: 10/18/2016 14:01    Procedures Procedures (including critical care time)  Medications Ordered in ED Medications  albuterol (PROVENTIL HFA;VENTOLIN HFA) 108 (90 Base) MCG/ACT inhaler 2 puff (2 puffs Inhalation Given 10/18/16 1449)     Initial Impression / Assessment and Plan / ED Course  I have reviewed the triage vital signs and the nursing notes.  Pertinent labs & imaging results that were available during my care of the patient were reviewed by me and considered in my medical decision making (see chart for details).     Vitals stable  Pt non-toxic appearing.  Sx's c/w bronchitis, pt agrees to prednisone, anti-tussive and dispensed albuterol MDI.   Strict return precautions given.    Final Clinical Impressions(s) / ED Diagnoses   Final diagnoses:  Bronchitis    New Prescriptions Discharge Medication List as of 10/18/2016  2:49 PM    START taking these medications   Details  guaiFENesin-codeine (ROBITUSSIN AC) 100-10 MG/5ML syrup Take 10 mLs by mouth 3 (three) times daily as needed., Starting Thu 10/18/2016, Print    predniSONE (DELTASONE) 20 MG tablet Take 2 tablets (40 mg total) by mouth daily.,  Starting Thu 10/18/2016, [employer], PA-C 10/20/16 1335    Mancel Bale, MD 10/22/16 [phone]

## 2017-11-20 IMAGING — DX DG CHEST 2V
2 series · 2 of 2 positions shown · non-contrast
Comparison: Chest x-ray dated May 21, 2008

CLINICAL DATA: Nonproductive cough, chest congestion, and head cold
symptoms for 2 weeks. Current smoker.

EXAM:
CHEST  2 VIEW

[chest pa]
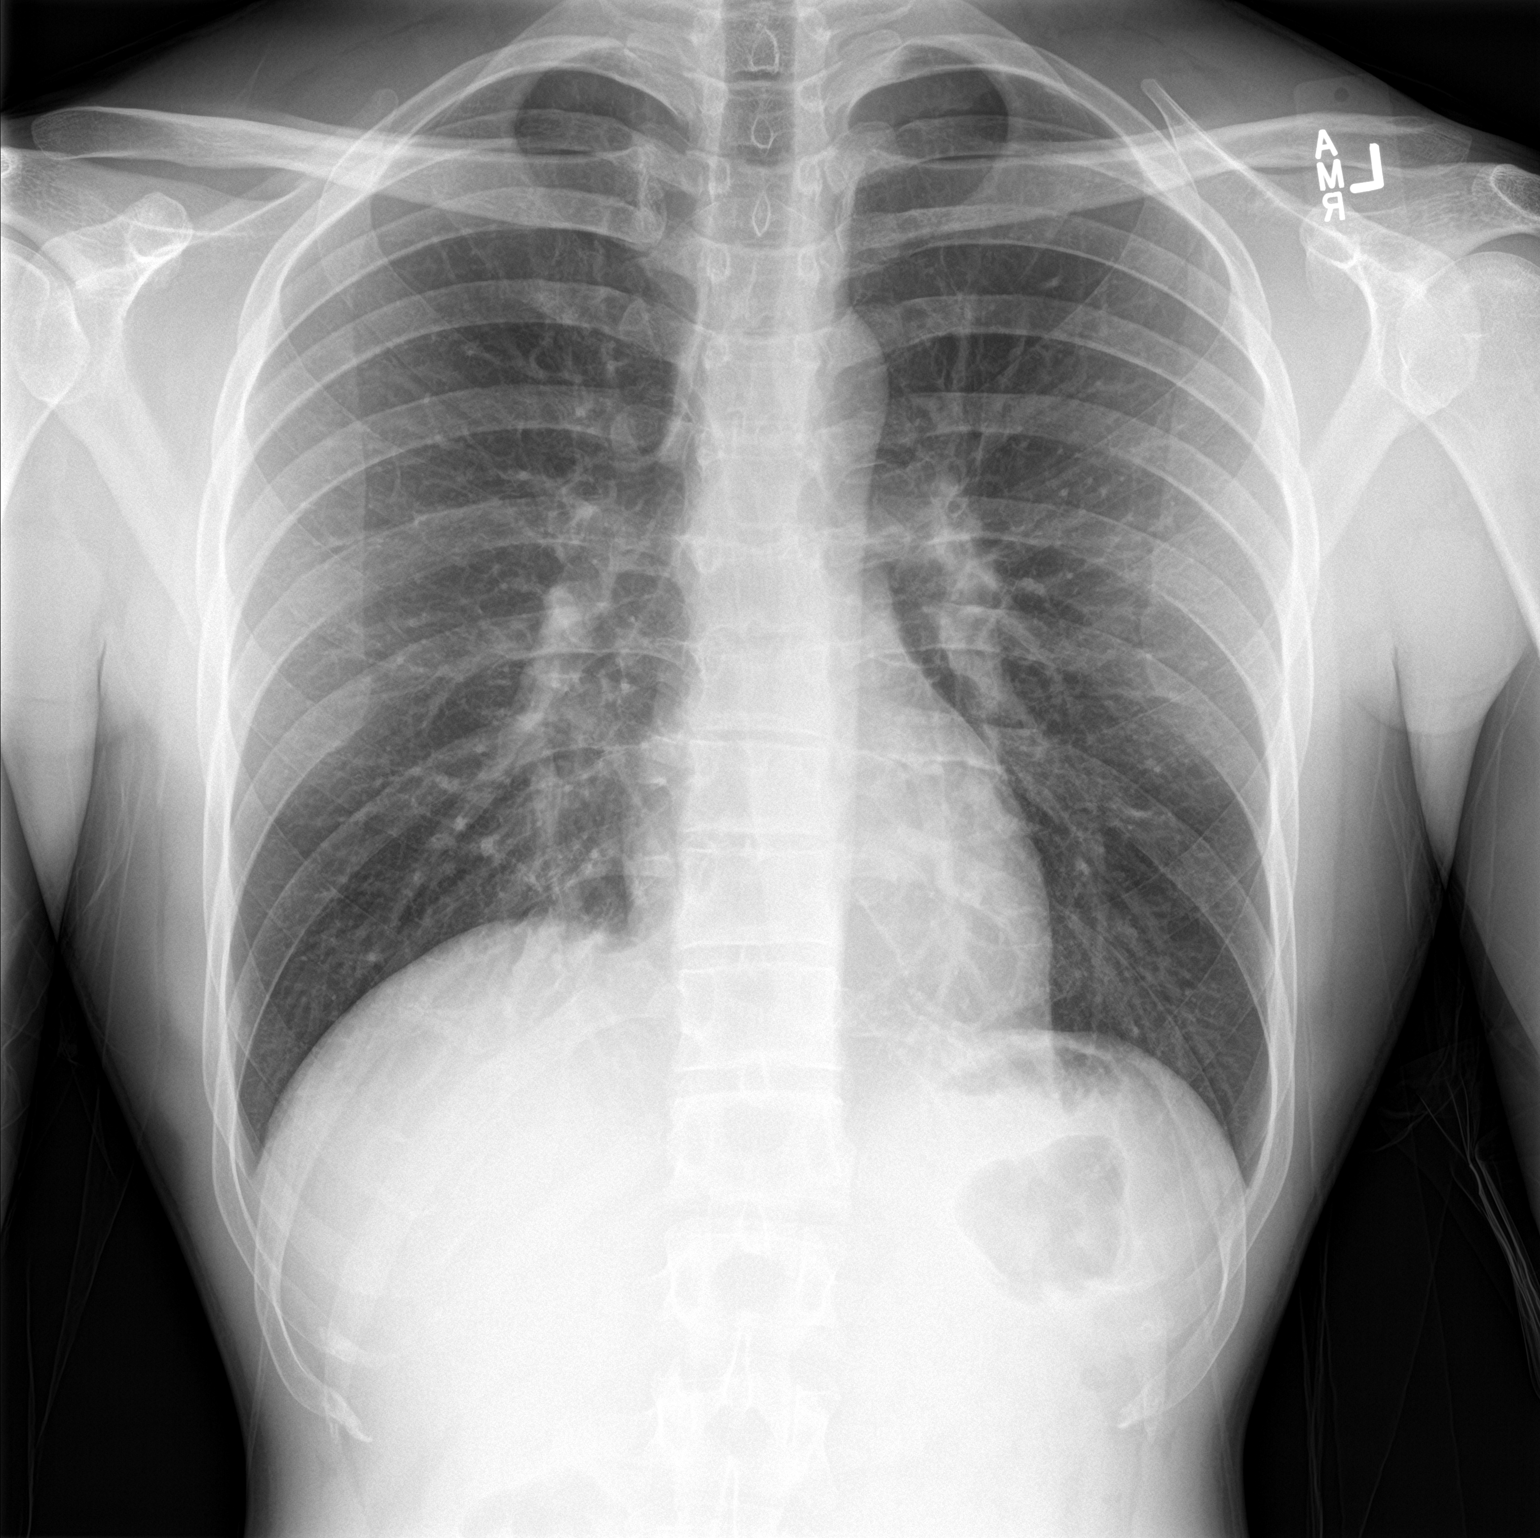

[chest lat]
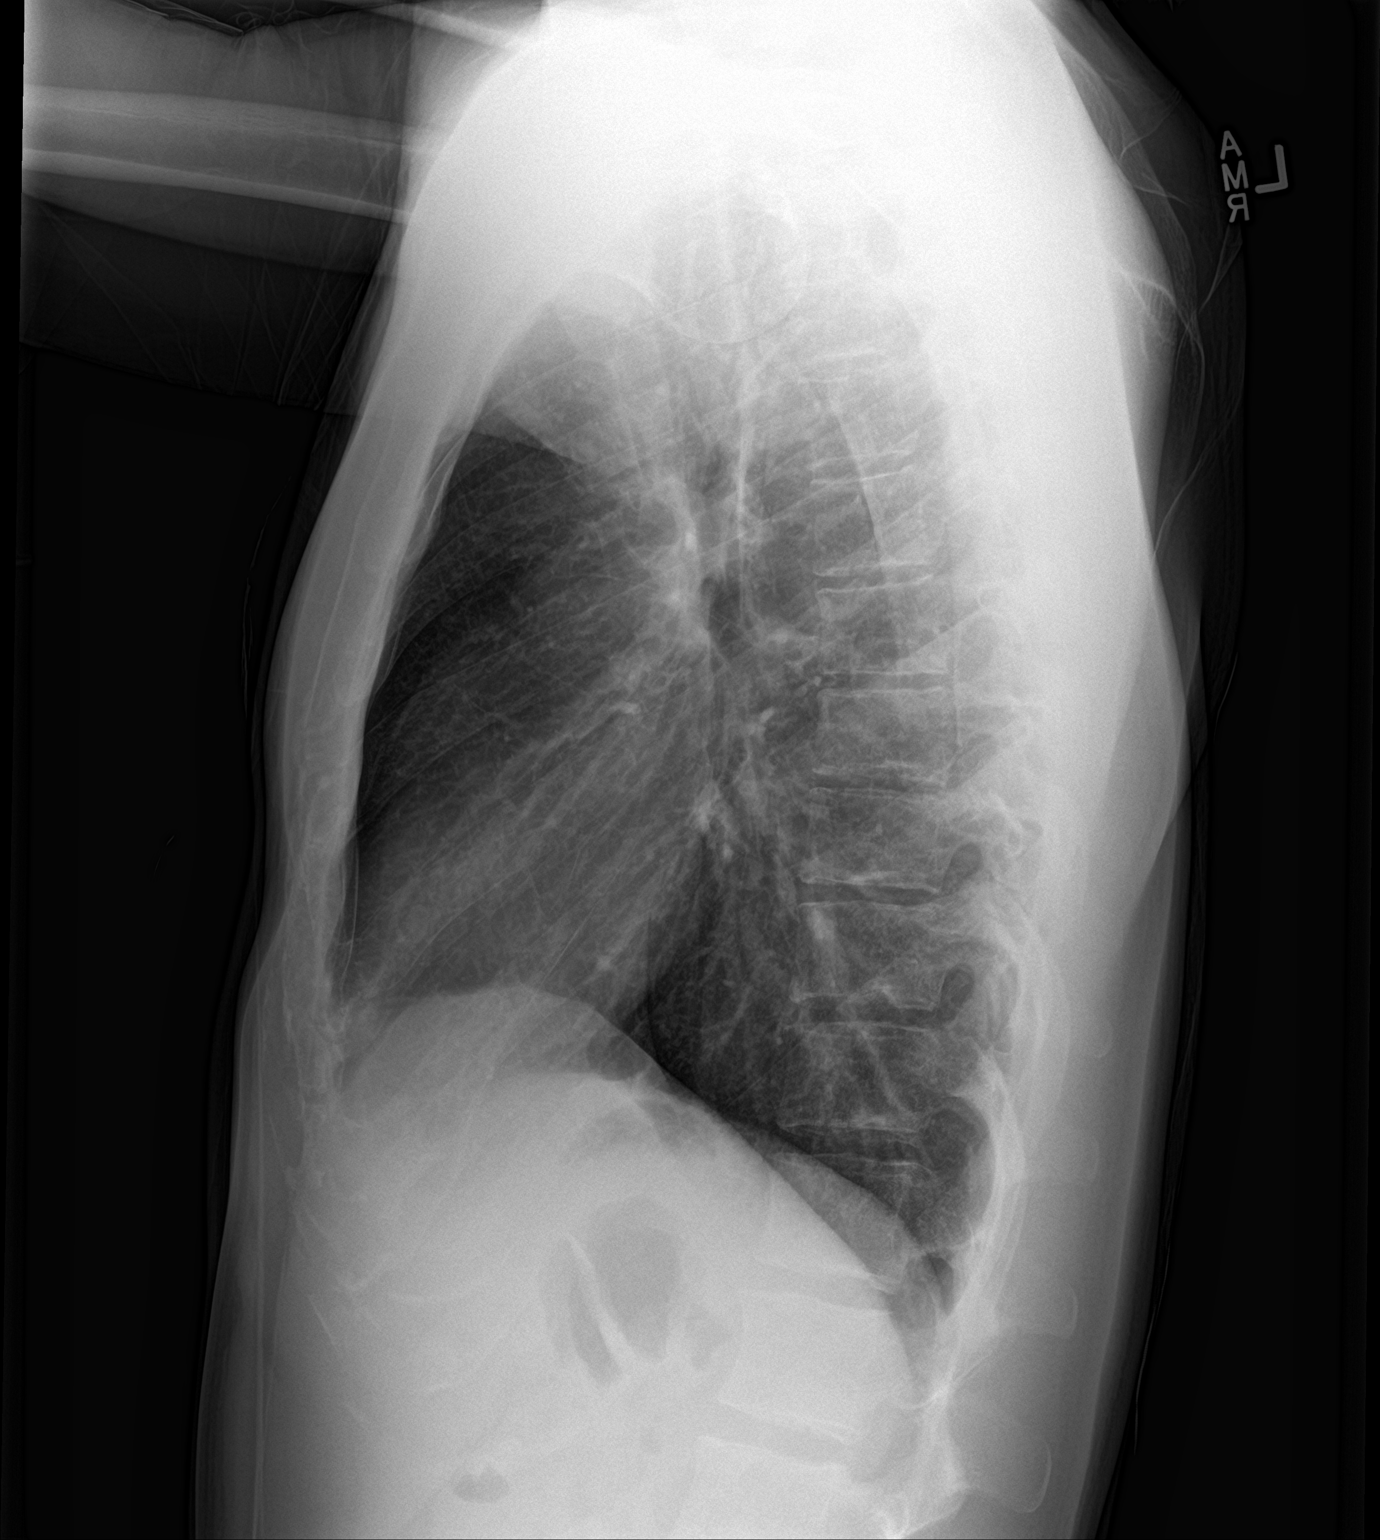

[2 of 2 positions shown; findings below may reference images not displayed]

FINDINGS: The lungs are mildly hyperinflated. The interstitial markings are
coarse though stable. The heart and pulmonary vascularity are
normal. The mediastinum is normal in width. The trachea is midline.
The bony thorax exhibits no acute abnormality.
IMPRESSION: Mild hyperinflation may be voluntary or may reflect acute bronchitic
change. No alveolar pneumonia.
